# Patient Record
Sex: Female | Born: 1955 | Race: Asian | Hispanic: No | Marital: Married | Smoking: Never smoker
Health system: Southern US, Community
[De-identification: ages and names within clinical notes are randomized; demographics above are authoritative.]

---

## 2017-01-24 ENCOUNTER — Encounter: Payer: Self-pay | Admitting: Physician Assistant

## 2017-01-24 ENCOUNTER — Ambulatory Visit (INDEPENDENT_AMBULATORY_CARE_PROVIDER_SITE_OTHER): Payer: Self-pay

## 2017-01-24 ENCOUNTER — Ambulatory Visit (INDEPENDENT_AMBULATORY_CARE_PROVIDER_SITE_OTHER): Payer: Self-pay | Admitting: Physician Assistant

## 2017-01-24 VITALS — BP 150/90 | HR 107 | Temp 99.4°F | Resp 17 | Ht <= 58 in | Wt 122.0 lb

## 2017-01-24 DIAGNOSIS — R059 Cough, unspecified: Secondary | ICD-10-CM

## 2017-01-24 DIAGNOSIS — R05 Cough: Secondary | ICD-10-CM

## 2017-01-24 DIAGNOSIS — I10 Essential (primary) hypertension: Secondary | ICD-10-CM

## 2017-01-24 DIAGNOSIS — J069 Acute upper respiratory infection, unspecified: Secondary | ICD-10-CM

## 2017-01-24 DIAGNOSIS — R9389 Abnormal findings on diagnostic imaging of other specified body structures: Secondary | ICD-10-CM

## 2017-01-24 DIAGNOSIS — R509 Fever, unspecified: Secondary | ICD-10-CM

## 2017-01-24 DIAGNOSIS — R938 Abnormal findings on diagnostic imaging of other specified body structures: Secondary | ICD-10-CM

## 2017-01-24 DIAGNOSIS — J309 Allergic rhinitis, unspecified: Secondary | ICD-10-CM

## 2017-01-24 LAB — POCT CBC
GRANULOCYTE PERCENT: 65.6 % (ref 37–80)
HEMATOCRIT: 36 % — AB (ref 37.7–47.9)
HEMOGLOBIN: 11.7 g/dL — AB (ref 12.2–16.2)
LYMPH, POC: 2.2 (ref 0.6–3.4)
MCH, POC: 24.7 pg — AB (ref 27–31.2)
MCHC: 32.4 g/dL (ref 31.8–35.4)
MCV: 76.2 fL — AB (ref 80–97)
MID (cbc): 0.5 (ref 0–0.9)
MPV: 9.1 fL (ref 0–99.8)
PLATELET COUNT, POC: 239 10*3/uL (ref 142–424)
POC GRANULOCYTE: 5.1 (ref 2–6.9)
POC LYMPH %: 28.1 % (ref 10–50)
POC MID %: 6.3 %M (ref 0–12)
RBC: 4.72 M/uL (ref 4.04–5.48)
RDW, POC: 14.3 %
WBC: 7.7 10*3/uL (ref 4.6–10.2)

## 2017-01-24 LAB — POC INFLUENZA A&B (BINAX/QUICKVUE)
INFLUENZA A, POC: NEGATIVE
Influenza B, POC: NEGATIVE

## 2017-01-24 MED ORDER — ACETAMINOPHEN 500 MG PO TABS: 500.0000 mg | ORAL_TABLET | Freq: Once | ORAL | Status: DC

## 2017-01-24 MED ORDER — ACETAMINOPHEN 500 MG PO TABS
1000.0000 mg | ORAL_TABLET | Freq: Once | ORAL | Status: AC
  Administered 2017-01-24: 1000 mg via ORAL

## 2017-01-24 MED ORDER — AZITHROMYCIN 250 MG PO TABS: ORAL_TABLET | ORAL | 0 refills | Status: AC

## 2017-01-24 MED ORDER — CETIRIZINE HCL 10 MG PO TABS: 10.0000 mg | ORAL_TABLET | Freq: Every day | ORAL | 11 refills | Status: AC

## 2017-01-24 MED ORDER — FLUTICASONE PROPIONATE 50 MCG/ACT NA SUSP: 2.0000 | Freq: Every day | NASAL | 0 refills | Status: AC

## 2017-01-24 MED ORDER — BENZONATATE 100 MG PO CAPS: 100.0000 mg | ORAL_CAPSULE | Freq: Three times a day (TID) | ORAL | 0 refills | Status: AC | PRN

## 2017-01-24 NOTE — Progress Notes (Signed)
MRN: [id number] DOB: 06/01/1956  Subjective:   Laurie Watts is a 61 y.o. female presenting for chief complaint of Headache (onset 4 days); Cough; and Fever .  Report 4 day history of worsening sinus congestion, sinus headache, ear fullness, sore throat, productive cough (no hemoptysis), shortness of breath, myalgia, subjective fever and chills. She has also been having sneezing, itchy watery eyes, and runny nose for the past 2 weeks. The cough has kept her from sleeping. Has tried nyquil with mild relief. Denies chest pain, nausea, vomiting and abdominal pain. She is visiting from [place] since 09/2016. Will return in 02/2017. Previously healthy until 4 days ago. Has had sick contact with grandson who also has a cough. Has moderate history of seasonal allergies but does not take anything daily. No history of asthma. Patient has not had flu shot this season. No smokingl. Denies any other aggravating or relieving factors, no other questions or concerns.  Has hx of HTN but is not currently on medication. Followed by a doctor in [place].   Reve has a current medication list which includes the following prescription(s): azithromycin, benzonatate, cetirizine, and fluticasone. Also has No Known Allergies.  Laurie Watts  has no past medical history on file. Also  has no past surgical history on file.   Objective:   Vitals: BP (!) 150/90 (BP Location: Left Arm, Patient Position: Sitting, Cuff Size: Normal)   Pulse (!) 107   Temp 99.4 F (37.4 C) (Oral)   Resp 17   Ht 4' 9.5" (1.461 m)   Wt 122 lb (55.3 kg)   SpO2 98%   BMI 25.94 kg/m   Physical Exam  Constitutional: She is oriented to person, place, and time. She appears well-developed and well-nourished. She appears distressed (appears uncomfortable sitting on exam table).  HENT:  Head: Normocephalic and atraumatic.  Right Ear: External ear and ear canal normal. Tympanic membrane is not erythematous. A middle ear effusion is present.  Left  Ear: External ear and ear canal normal. Tympanic membrane is not erythematous. A middle ear effusion is present.  Nose: Mucosal edema (moderate bilaterally) present. Right sinus exhibits frontal sinus tenderness ( mild). Right sinus exhibits no maxillary sinus tenderness. Left sinus exhibits frontal sinus tenderness ( mild). Left sinus exhibits no maxillary sinus tenderness.  Mouth/Throat: Uvula is midline and mucous membranes are normal. Posterior oropharyngeal erythema present. Tonsils are 1+ on the right. Tonsils are 1+ on the left. No tonsillar exudate.  Eyes: Conjunctivae are normal.  Neck: Normal range of motion.  Cardiovascular: Regular rhythm and normal heart sounds.  Tachycardia present.   Pulmonary/Chest: Effort normal and breath sounds normal. She has no wheezes. She has no rhonchi. She has no rales.  Lymphadenopathy:       Head (right side): No submental, no submandibular, no tonsillar, no preauricular, no posterior auricular and no occipital adenopathy present.       Head (left side): No submental, no submandibular, no tonsillar, no preauricular, no posterior auricular and no occipital adenopathy present.    She has no cervical adenopathy.       Right: No supraclavicular adenopathy present.       Left: No supraclavicular adenopathy present.  Neurological: She is alert and oriented to person, place, and time. No cranial nerve deficit.  Reflex Scores:      Patellar reflexes are 2+ on the right side and 2+ on the left side. Skin: Skin is warm and dry.  Psychiatric: She has a normal mood and affect.  Vitals reviewed.   Results for orders placed or performed in visit on 01/24/17 (from the past 24 hour(s))  POC Influenza A&B(BINAX/QUICKVUE)     Status: None   Collection Time: 01/24/17  9:29 AM  Result Value Ref Range   Influenza A, POC Negative Negative   Influenza B, POC Negative Negative  POCT CBC     Status: Abnormal   Collection Time: 01/24/17 10:06 AM  Result Value Ref Range     WBC 7.7 4.6 - 10.2 K/uL   Lymph, poc 2.2 0.6 - 3.4   POC LYMPH PERCENT 28.1 10 - 50 %L   MID (cbc) 0.5 0 - 0.9   POC MID % 6.3 0 - 12 %M   POC Granulocyte 5.1 2 - 6.9   Granulocyte percent 65.6 37 - 80 %G   RBC 4.72 4.04 - 5.48 M/uL   Hemoglobin 11.7 (A) 12.2 - 16.2 g/dL   HCT, POC 16.136.0 (A) 09.637.7 - 47.9 %   MCV 76.2 (A) 80 - 97 fL   MCH, POC 24.7 (A) 27 - 31.2 pg   MCHC 32.4 31.8 - 35.4 g/dL   RDW, POC 04.514.3 %   Platelet Count, POC 239 142 - 424 K/uL   MPV 9.1 0 - 99.8 fL   Dg Chest 2 View  Result Date: 01/24/2017 CLINICAL DATA:  Cough for 4 days.  Fever EXAM: CHEST  2 VIEW COMPARISON:  None. FINDINGS: There is a small calcified granuloma in the right mid lung. No edema or consolidation. Heart size and pulmonary vascularity are normal. No adenopathy. No bone lesions. IMPRESSION: Small granuloma right mid lung.  No edema or consolidation. Electronically Signed   By: Bretta BangWilliam  Woodruff III M.D.   On: 01/24/2017 09:56    Assessment and Plan :  1. Fever, unspecified fever cause - POC Influenza A&B(BINAX/QUICKVUE) - POCT CBC - DG Chest 2 View; Future - acetaminophen (TYLENOL) tablet 1,000 mg; Take 2 tablets (1,000 mg total) by mouth once.  2. Cough 3. Acute upper respiratory infection Concerned for early bacterial infection due to productive cough and fever. Will treat empirically with antibiotic at this time. Pt instructed to follow up in 2 days for reevaluation in clinic. - benzonatate (TESSALON) 100 MG capsule; Take 1-2 capsules (100-200 mg total) by mouth 3 (three) times daily as needed for cough.  Dispense: 40 capsule; Refill: 0 - azithromycin (ZITHROMAX) 250 MG tablet; Take 2 tabs PO x 1 dose, then 1 tab PO QD x 4 days  Dispense: 6 tablet; Refill: 0  4. Allergic rhinitis, unspecified seasonality, unspecified trigger - fluticasone (FLONASE) 50 MCG/ACT nasal spray; Place 2 sprays into both nostrils daily.  Dispense: 16 g; Refill: 0 - cetirizine (ZYRTEC) 10 MG tablet; Take 1 tablet  (10 mg total) by mouth daily.  Dispense: 30 tablet; Refill: 11  5. Abnormal CXR Discussed calcified granuloma with patient and her daughter. Pt notes she has had CXRs in [place]. I have encouraged her to discuss this result with her PCP so he can evaluate how long this has been there and if she needs further imaging. She understands and agrees to discuss this with her PCP when she returns to [place].   Benjiman CoreBrittany Shanvi Moyd, PA-C  [facility] 01/24/2017 5:12 PM

## 2017-01-24 NOTE — Patient Instructions (Addendum)
Your CXR and CBC was reassuring. However there is a possibility that you could have an early pneumonia. I am therefore going to treat you for both a cough and for allergies. For the cough, please start a zpack. Take tessalon perles daily for the cough. You can use OTC delsym for cough at night time. For the allergies, please start flonase and zyrtec daily. I would like you to follow up in 2 days for reevaluation. Return sooner if symptoms worsen. Thank you for letting me participate in your health and well being.  Cough, Adult A cough helps to clear your throat and lungs. A cough may last only 2-3 weeks (acute), or it may last longer than 8 weeks (chronic). Many different things can cause a cough. A cough may be a sign of an illness or another medical condition. Follow these instructions at home:  Pay attention to any changes in your cough.  Take medicines only as told by your doctor.  If you were prescribed an antibiotic medicine, take it as told by your doctor. Do not stop taking it even if you start to feel better.  Talk with your doctor before you try using a cough medicine.  Drink enough fluid to keep your pee (urine) clear or pale yellow.  If the air is dry, use a cold steam vaporizer or humidifier in your home.  Stay away from things that make you cough at work or at home.  If your cough is worse at night, try using extra pillows to raise your head up higher while you sleep.  Do not smoke, and try not to be around smoke. If you need help quitting, ask your doctor.  Do not have caffeine.  Do not drink alcohol.  Rest as needed. Contact a doctor if:  You have new problems (symptoms).  You cough up yellow fluid (pus).  Your cough does not get better after 2-3 weeks, or your cough gets worse.  Medicine does not help your cough and you are not sleeping well.  You have pain that gets worse or pain that is not helped with medicine.  You have a fever.  You are losing weight  and you do not know why.  You have night sweats. Get help right away if:  You cough up blood.  You have trouble breathing.  Your heartbeat is very fast. This information is not intended to replace advice given to you by your health care provider. Make sure you discuss any questions you have with your health care provider. Document Released: 05/18/2011 Document Revised: 02/10/2016 Document Reviewed: 11/11/2014 Elsevier Interactive Patient Education  2017 [employer]    IF you received an x-ray today, you will receive an invoice from [facility]. Please contact [facility] at [phone] with questions or concerns regarding your invoice.   IF you received labwork today, you will receive an invoice from [facility]. Please contact [facility] at [phone] with questions or concerns regarding your invoice.   Our billing staff will not be able to assist you with questions regarding bills from these companies.  You will be contacted with the lab results as soon as they are available. The fastest way to get your results is to activate your My Chart account. Instructions are located on the last page of this paperwork. If you have not heard from us regarding the results in 2 weeks, please contact this office.

## 2017-01-26 ENCOUNTER — Ambulatory Visit: Payer: Self-pay | Admitting: Physician Assistant

## 2018-02-06 IMAGING — DX DG CHEST 2V
2 series · 2 of 2 positions shown · non-contrast
Comparison: None.

CLINICAL DATA: Cough for 4 days.  Fever

EXAM:
CHEST  2 VIEW

[chest pa]
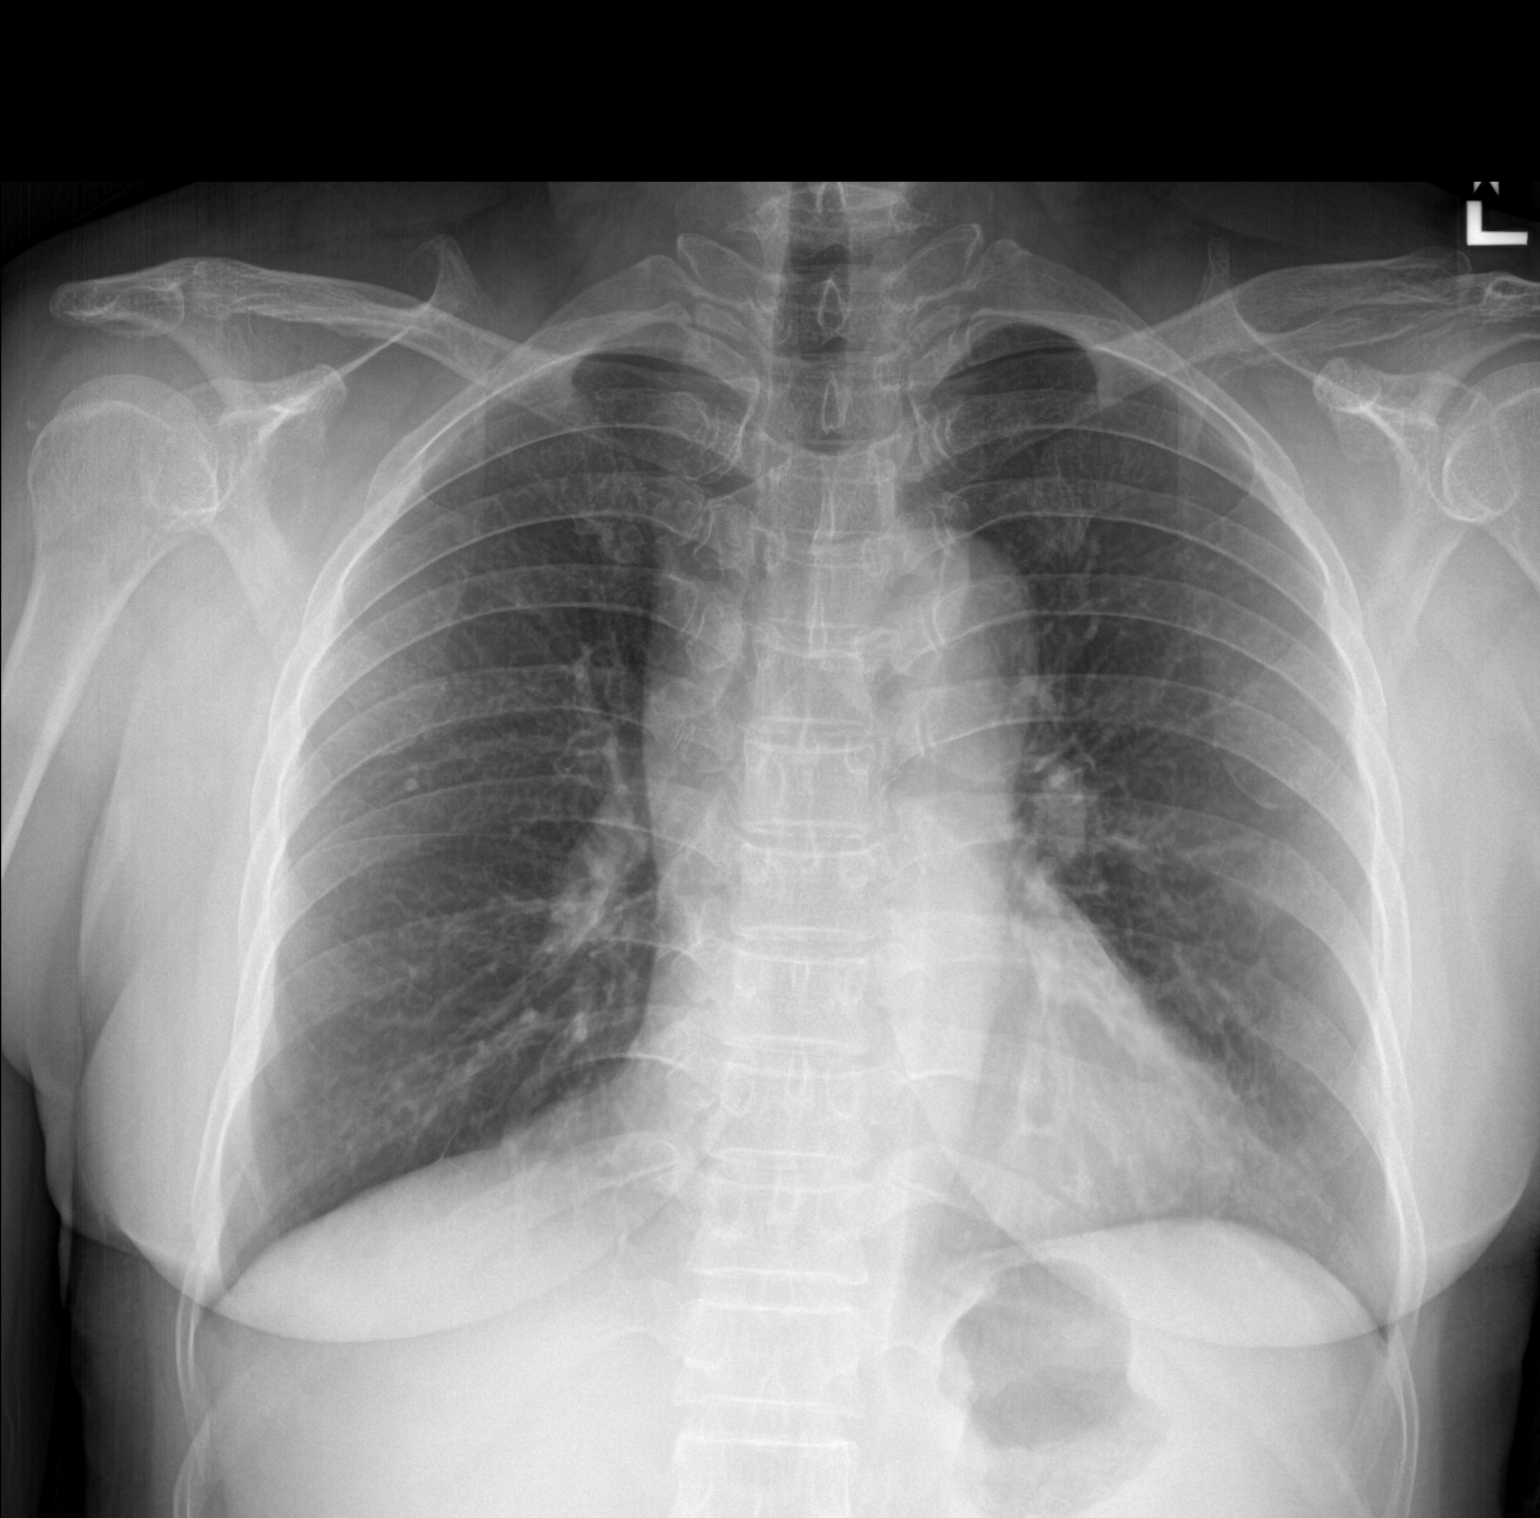

[chest lat]
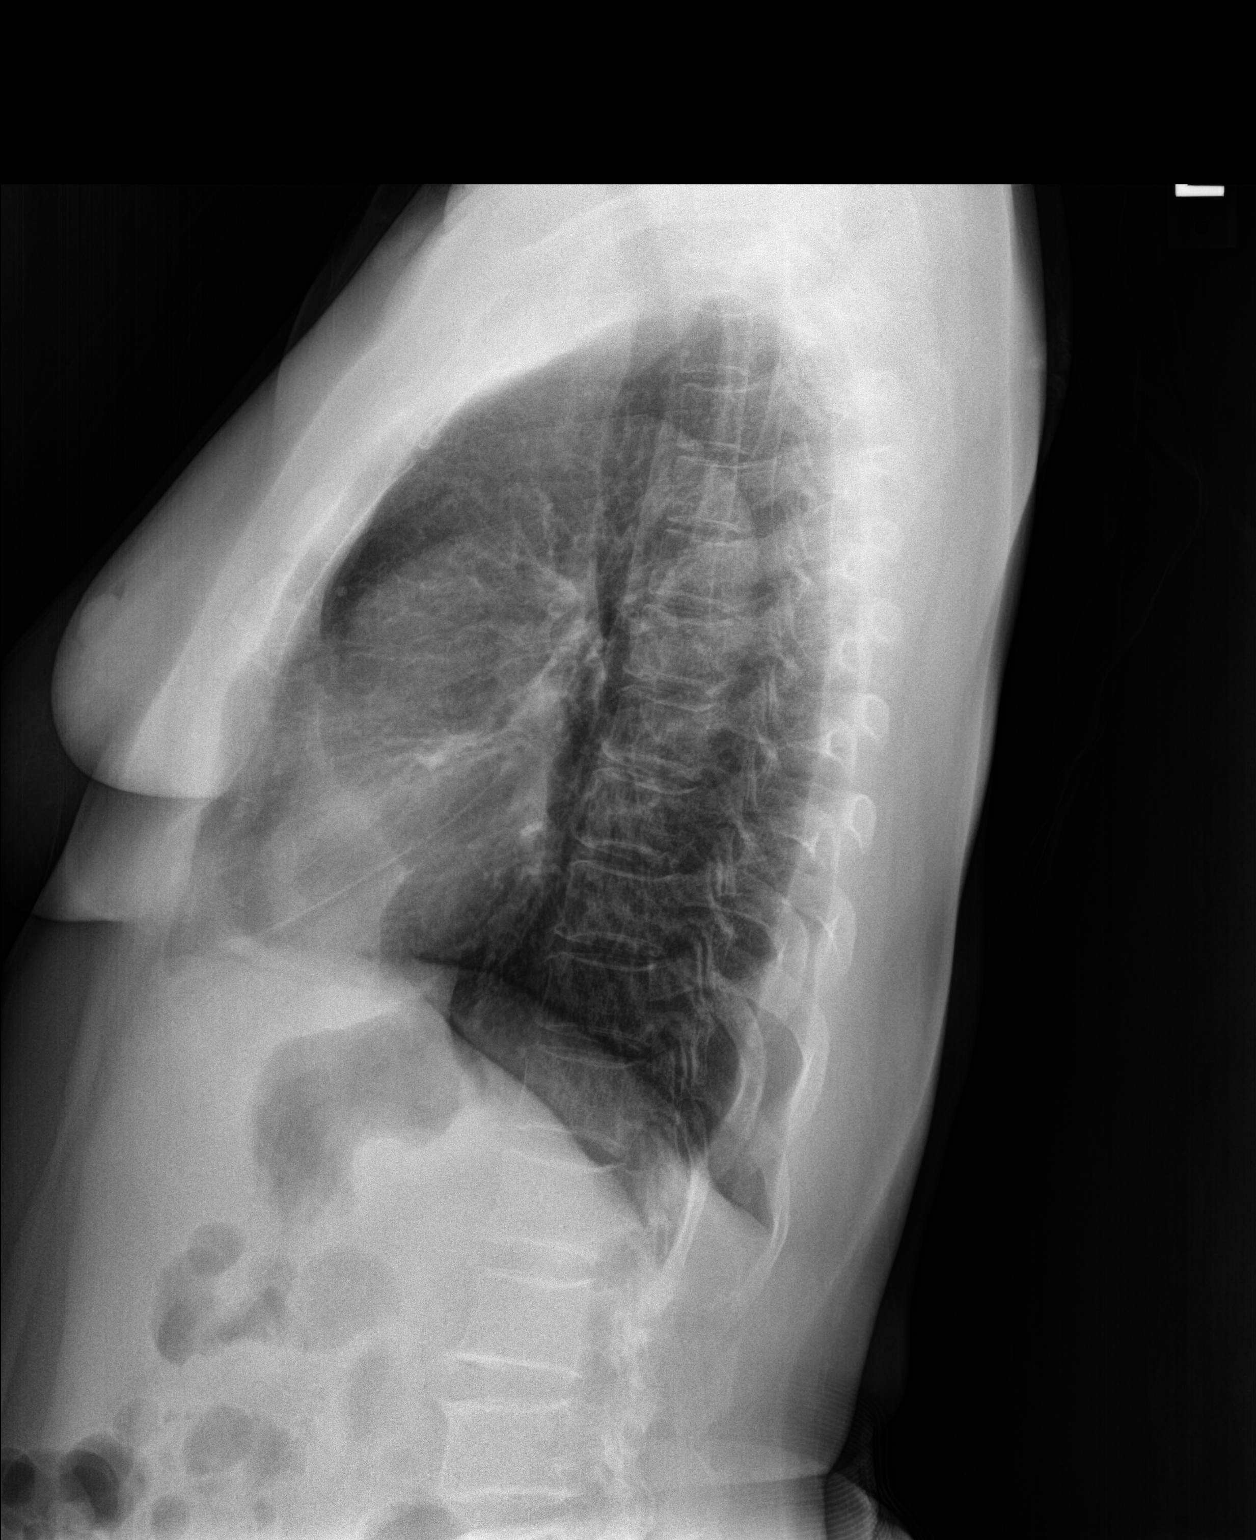

[2 of 2 positions shown; findings below may reference images not displayed]

FINDINGS: There is a small calcified granuloma in the right mid lung. No edema
or consolidation. Heart size and pulmonary vascularity are normal.
No adenopathy. No bone lesions.
IMPRESSION: Small granuloma right mid lung.  No edema or consolidation.
